# Patient Record
Sex: Female | Born: 1945 | Race: White | Hispanic: No | Smoking: Never smoker
Health system: Southern US, Community
[De-identification: ages and names within clinical notes are randomized; demographics above are authoritative.]

## PROBLEM LIST (undated history)

## (undated) DIAGNOSIS — I2510 Atherosclerotic heart disease of native coronary artery without angina pectoris: Secondary | ICD-10-CM

## (undated) DIAGNOSIS — E119 Type 2 diabetes mellitus without complications: Secondary | ICD-10-CM

## (undated) DIAGNOSIS — I10 Essential (primary) hypertension: Secondary | ICD-10-CM

## (undated) DIAGNOSIS — E785 Hyperlipidemia, unspecified: Secondary | ICD-10-CM

## (undated) HISTORY — DX: Type 2 diabetes mellitus without complications: E11.9

## (undated) HISTORY — DX: Hyperlipidemia, unspecified: E78.5

## (undated) HISTORY — DX: Atherosclerotic heart disease of native coronary artery without angina pectoris: I25.10

## (undated) HISTORY — DX: Essential (primary) hypertension: I10

---

## 2007-04-09 HISTORY — PX: WRIST SURGERY: SHX841

## 2007-10-31 ENCOUNTER — Inpatient Hospital Stay (HOSPITAL_COMMUNITY): Admission: EM | Admit: 2007-10-31 | Discharge: 2007-11-02 | Payer: Self-pay | Admitting: Emergency Medicine

## 2009-02-16 ENCOUNTER — Emergency Department (HOSPITAL_COMMUNITY): Admission: EM | Admit: 2009-02-16 | Discharge: 2009-02-16 | Payer: Self-pay | Admitting: Emergency Medicine
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
## 2010-08-21 NOTE — Op Note (Signed)
NAME:  Christine Evans, Christine Evans NO.:  [id number]   MEDICAL RECORD NO.:  [id number]          PATIENT TYPE:  INP   LOCATION:  6529                         FACILITY:  [facility]   PHYSICIAN:  Madelynn Done, MD  DATE OF BIRTH:  January 25, 1946   DATE OF PROCEDURE:  11/01/2007  DATE OF DISCHARGE:                               OPERATIVE REPORT   PREOPERATIVE DIAGNOSES:  1. Left intra-articular distal radius fracture, 4 or more fragments.  2. Acute left carpal tunnel syndrome.   POSTOPERATIVE DIAGNOSES:  1. Left intra-articular distal radius fracture, 4 or more fragments.  2. Acute left carpal tunnel syndrome.   ATTENDING SURGEON:  Madelynn Done, MD, who has scrubbed and present  for the entire procedure.   ASSISTANT SURGEON:  None.   PROCEDURES:  1. Open treatment of left intra-articular distal radius fracture with      internal fixation, 4 or more fragments.  2. Left carpal tunnel release.  3. Stress radiography, left wrist.   ANESTHESIA:  General via LMA.   SURGICAL IMPLANT:  [employer] volar distal radius plate with  locking pegs distally and 3.5 bicortical screws proximally, 3 screws.   TOURNIQUET TIME:  78 minutes at 250 mmHg.   SURGICAL INDICATIONS:  Christine Evans is a 65 year old right-hand dominant  female who sustained a fall yesterday sustaining a closed left distal  radius fracture.  She was reduced in the emergency department, and after  reduction, the patient noted to continue to have displacement of the  intra-articular fracture.  After the treatment options were discussed  with her, we elected to proceed with the above procedure.  The patient  was also having numbness and tingling in her median nerve distribution,  and it was felt necessary to decompress the median nerve.  The risks,  benefits, and alternatives were discussed in detail with the patient,  and signed informed consent was obtained.   DESCRIPTION OF PROCEDURE:  The patient was properly  identified in the  preoperative holding area and a marker was made on the left wrist to  indicate correct operative site.  The patient was then brought back to  the operating room, placed supine on the anesthesia room table.  General  anesthesia was administered via endotracheal tube.  The patient  tolerated this well.  A well-padded tourniquet was then placed on the  left brachium and sealed with 1000 drape.  The left upper extremity was  then prepped with Hibiclens and then sterilely draped.  A time-out was  called, correct side was identified, and the procedure was then begun.  Preoperative antibiotics were given prior to any skin incision.  Attention was then first turned to the carpal tunnel where a  longitudinal incision was then made directly in line with the radial  border of the ring finger.  In the mid-palm, dissection was carried down  through the skin and subcutaneous tissue to identify the palmar fascia.  The palmar fascia was then identified and incised longitudinally.  Prior  to the carpal tunnel, the limb had been  exsanguinated and tourniquet  insufflated to  250 mmHg.  Dissection was carried down to identify the  transverse carpal ligament under direct visualization with a 15 blade.  The transverse carpal ligament was then released, both proximally and  distally.  There was a small amount of blood within the carpal canal.  Following carpal tunnel release, the wound was then thoroughly  irrigated.  The skin was then closed with 4-0 nylon horizontal mattress  sutures.  Attention was then turned to the distal radius through a  separate incision.  A longitudinal incision was made directly over the  FCR sheath.  Dissection was carried down through the skin and  subcutaneous tissues.  Going between the interval of the radial artery  and the FCR, the FCR sheath was then opened proximally and distally.  This allowed for retraction of FCR ulnarly.  Going through the floor of  the  FCR sheath, the FPL was identified, and the fracture site was then  exposed through elevation of the pronator quadratus.  The distal portion  of pronator quadratus was in the fracture site, and what was allowed to  the pronator quadratus was then elevated off the proximal radial shaft.  Dissection was carried down to the fracture site where there was a  significant comminution of the distal radius with the intra-articular  fragments of 4 more fragments.  An open reduction was then performed.  The patient did have a defect of the metaphysis; therefore, 5 mL of the  OrthoBlast bone substitute was infiltrated into the defect and packed  dorsally.  After packing of the dorsal defect through the volar wound,  the volar distal radius plate was then fashioned.  Using a free 3.5  bicortical screw to the oblong hole, the plate was then fixed proximally  and then the plate height was then adjusted, both radially and ulnarly  using the mini C-arm.  After appropriate adequate placement of the  plate, attention was then turned to the distal fixation then using the  locking pegs.  The locking pegs were then filled distally from an ulnar  to radial direction.  All locking pegs were then placed, smooth pegs.  Following this, images were then obtained; AP, lateral, and oblique  planes, to confirm that there was no intra-articular placement of the  pegs.  Following the distal fixation, attention was then turned  proximally where 2 more bicortical 3.5 screws were then placed.  After  placement of all the fixation, the wound was then thoroughly irrigated.  Final mini C-arm images were then obtained, stress radiography was then  done.  The distal radioulnar joint was also assessed and there was not  any instability in pronation, neutral, and supination of the distal  radioulnar joint.  Following this, there was not any pronator quadratus  to close.  A TLS #7 drain was then placed deep in the subcutaneous   tissues.  The subcutaneous tissues were then closed with 4-0 Vicryl  suture.  The skin was then closed with a running horizontal mattress 4-0  nylon suture.  Adaptic dressing, 20 mL of 0.25% Marcaine was infiltrated  in the wound.  A sterile compressive dressing was then applied.  The  patient was then placed in a well-padded long arm sugar-tong splint for  comfort.   She was extubated and taken to recovery room in good condition.   INTRAOPERATIVE RADIOGRAPHS:  Three views of the wrist do show the volar  plate fixation in place.  There was good restoration of the radial  height, inclination,  and tilt.   POSTOPERATIVE PLAN:  The patient will be admitted overnight for pain  control and IV antibiotics.  She will be discharged when these measures  are accomplished.  She will be seen back in the office in 2 weeks for  wound check and suture removal and then likely transition to a short-arm  cast.  Radiographs at 2-week, 4-week, and 6-week mark.  Likely begin  some gentle motion of the wrist at the 4-week mark.      Madelynn Done, MD  Electronically Signed     FWO/MEDQ  D:  11/01/2007  T:  11/02/2007  Job:  [id number]
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
## 2010-08-24 NOTE — Discharge Summary (Signed)
NAME:  Christine Evans, Christine Evans NO.:  [id number]   MEDICAL RECORD NO.:  [id number]          PATIENT TYPE:  INP   LOCATION:  6529                         FACILITY:  [facility]   PHYSICIAN:  Madelynn Done, MD  DATE OF BIRTH:  01-23-1946   DATE OF ADMISSION:  10/31/2007  DATE OF DISCHARGE:  11/02/2007                               DISCHARGE SUMMARY   ADMISSION DIAGNOSIS:  1. Left wrist intra-articular distal radius fracture.  2. Fall at work.  3. Carpal tunnel.   PROCEDURES AND DATES:  Open reduction and internal fixation of left  distal radius on November 01, 2007.  Left carpal tunnel release.   DISCHARGE MEDICATIONS:  1. Percocet 5/325 one to two tablets every 4 to 6 hours as needed for      pain.  2. Colace 100 mg p.o. b.i.d.  3. Robaxin 500 mg p.o. q.6 h. p.r.n. spasm.   REASON FOR ADMISSION:  Ms. Hyser is a 65-year-old female who was working  and fell backwards landing on an outstretched left wrist.  The patient  was seen and evaluated in the emergency department.  Given the placement  of her injury, she underwent a closed manipulation in the ER.  Closed  manipulation resulted in improvement in alignment, but she still had  displacement and based on her degree of injury and displacement, it was  recommended that she undergo the above procedure.  She was admitted for  pain control and undergo surgery within 24 hours.   HOSPITAL COURSE:  The patient underwent the above procedure under  general anesthesia.  She tolerated this well.  Following surgery, she  was on her pain meds.  She was controlled on IV pain medication.  She  was seen and examined on postoperative day #1.  Her drain was removed  and she was wriggling her fingers and her hand looked good.  Her pain  was controlled on oral pain medication.  She was tolerating a regular  diet and felt ready to be discharged to home.   RECOMMENDATIONS AND DISPOSITION:  The patient is discharged home.  She  will be seen back  in the office in 10 days for wound check, suture  removal, and x-rays, and likely then transition into a short-arm cast.  She will be out of work until her first followup visit.  She was  instructed to keep her splint clean and dry.  She is instructed to call  the office if she does have any worsening fevers, chills, nausea,  vomiting, worsening pain, or problems with her hand.   CONDITION AT DISCHARGE:  Well and good.     Madelynn Done, MD  Electronically Signed    FWO/MEDQ  D:  11/05/2007  T:  11/05/2007  Job:  [id number]

## 2011-01-04 LAB — CBC
HCT: 34.6 — ABNORMAL LOW
Hemoglobin: 11.9 — ABNORMAL LOW
MCHC: 34.4
MCV: 89.9
Platelets: 161
RBC: 3.85 — ABNORMAL LOW
RDW: 12.4
WBC: 6.7

## 2011-01-04 LAB — BASIC METABOLIC PANEL
BUN: 14
CO2: 23
Calcium: 8.8
Chloride: 105
Creatinine, Ser: 0.76
GFR calc Af Amer: >60
GFR calc non Af Amer: >60
Glucose, Bld: 236 — ABNORMAL HIGH
Potassium: 4
Sodium: 133 — ABNORMAL LOW

## 2012-09-09 ENCOUNTER — Other Ambulatory Visit: Payer: Self-pay | Admitting: Family Medicine

## 2012-09-09 DIAGNOSIS — Z139 Encounter for screening, unspecified: Secondary | ICD-10-CM

## 2012-09-15 ENCOUNTER — Ambulatory Visit: Payer: Self-pay

## 2013-04-08 HISTORY — PX: HIP FRACTURE SURGERY: SHX118

## 2017-06-18 ENCOUNTER — Ambulatory Visit: Payer: Medicare HMO | Admitting: Neurology

## 2017-08-14 ENCOUNTER — Encounter: Payer: Self-pay | Admitting: Neurology

## 2017-08-14 ENCOUNTER — Telehealth: Payer: Self-pay

## 2017-08-14 ENCOUNTER — Ambulatory Visit: Payer: Medicare HMO | Admitting: Neurology

## 2017-08-14 NOTE — Telephone Encounter (Signed)
Patient no show for new patient appt today. 

## 2019-01-15 ENCOUNTER — Ambulatory Visit: Payer: Self-pay | Admitting: Cardiology

## 2019-02-24 ENCOUNTER — Ambulatory Visit: Payer: Self-pay | Admitting: Cardiology

## 2020-01-06 ENCOUNTER — Ambulatory Visit: Payer: Self-pay | Admitting: Physician Assistant

## 2020-06-02 ENCOUNTER — Encounter: Payer: Self-pay | Admitting: Cardiology

## 2020-06-02 ENCOUNTER — Other Ambulatory Visit: Payer: Self-pay

## 2020-06-02 ENCOUNTER — Ambulatory Visit: Payer: Medicare HMO | Admitting: Cardiology

## 2020-06-02 VITALS — BP 140/63 | HR 61 | Temp 97.2°F | Resp 16 | Ht 67.0 in | Wt 179.0 lb

## 2020-06-02 DIAGNOSIS — I10 Essential (primary) hypertension: Secondary | ICD-10-CM

## 2020-06-02 DIAGNOSIS — I25119 Atherosclerotic heart disease of native coronary artery with unspecified angina pectoris: Secondary | ICD-10-CM

## 2020-06-02 DIAGNOSIS — E7800 Pure hypercholesterolemia, unspecified: Secondary | ICD-10-CM

## 2020-06-02 DIAGNOSIS — R0789 Other chest pain: Secondary | ICD-10-CM

## 2020-06-02 DIAGNOSIS — R0600 Dyspnea, unspecified: Secondary | ICD-10-CM

## 2020-06-02 DIAGNOSIS — R0989 Other specified symptoms and signs involving the circulatory and respiratory systems: Secondary | ICD-10-CM

## 2020-06-02 DIAGNOSIS — R0609 Other forms of dyspnea: Secondary | ICD-10-CM

## 2020-06-02 MED ORDER — LISINOPRIL-HYDROCHLOROTHIAZIDE 10-12.5 MG PO TABS: 1.0000 | ORAL_TABLET | ORAL | 2 refills | Status: DC

## 2020-06-02 NOTE — Progress Notes (Signed)
Primary Physician/Referring:  No primary care provider on file.  Patient ID: Christine Evans, female    DOB: [birth year], 75 y.o.   MRN: [id number]  Chief Complaint  Patient presents with  . Coronary Artery Disease  . Hyperlipidemia  . New Patient (Initial Visit)    Referred by Bing Matter  . Hypertension   HPI:    Christine Evans  is a 75 y.o. Caucasian female with coronary artery disease with history of MI, underwent stenting to the LAD in twenty thirteen at [facility], hypertension, hyperlipidemia and diabetes mellitus.  I had last seen her in 2018.  Due to COVID-19 pandemic, she has not seen physicians in a while appropriately.  She was out of her cholesterol medication for the past 2 months.  She recently saw [name] and was started back on atorvastatin.  She was referred back to me due to chest pain, dyspnea on exertion and to follow-up on coronary artery disease.  Patient describes chest discomfort as tightness to heaviness and sometimes sharp.  Symptoms started a few months ago.  Her activity is limited due to arthritis.  She is also noticed dyspnea on exertion in spite of arthritis when she tries to help climb up a flight of stairs she has to rest.  The symptoms are new over the past 6 months or so to a year.  No PND or orthopnea, no leg edema.  Past Medical History:  Diagnosis Date  . CAD (coronary artery disease)   . Coronary artery disease   . Diabetes mellitus without complication ([place])   . Hyperlipidemia   . Hypertension    Past Surgical History:  Procedure Laterality Date  . HIP FRACTURE SURGERY Right 2015  . WRIST SURGERY Left 2009   Family History  Problem Relation Age of Onset  . Heart disease Mother   . Heart failure Mother   . Alzheimer's disease Mother   . Heart disease Brother   . Heart failure Brother     Social History   Tobacco Use  . Smoking status: Never Smoker  . Smokeless tobacco: Never Used  Substance Use Topics  .  Alcohol use: Not Currently   Marital Status: Widowed  ROS  Review of Systems  Constitutional: Negative.  Cardiovascular: Positive for chest pain and dyspnea on exertion. Negative for leg swelling.  Musculoskeletal: Positive for arthritis and joint pain.  Gastrointestinal: Negative for melena.   Objective  Blood pressure 140/63, pulse 61, temperature (!) 97.2 F (36.2 C), temperature source Temporal, resp. rate 16, height 5' 7"  (1.702 m), weight 179 lb (81.2 kg), SpO2 99 %.  Vitals with BMI 06/02/2020  Height 5' 7"   Weight 179 lbs  BMI 49.44  Systolic 967  Diastolic 63  Pulse 61     Physical Exam Cardiovascular:     Rate and Rhythm: Normal rate and regular rhythm.     Pulses: Intact distal pulses.          Carotid pulses are on the right side with bruit and on the left side with bruit.    Heart sounds: Murmur heard.   Early systolic murmur is present with a grade of 2/6 at the upper right sternal border. No gallop.      Comments: No leg edema, no JVD. Pulmonary:     Effort: Pulmonary effort is normal.     Breath sounds: Normal breath sounds.  Abdominal:     General: Bowel sounds are normal.     Palpations:  Abdomen is soft.    Laboratory examination:   External labs:   Labs 05/18/2020:  Sodium one forty-one, potassium 4.3, serum glucose 123 mg, BUN one eight, creatinine 0.89, EGFR >60 mL.  CMP otherwise normal.  A1c 6.7%.  TSH normal.  Hb 13.5/HCT 40.9, platelets 210   Total cholesterol 223, triglycerides 336, HDL forty-one, direct LDL 116, non-HDL cholesterol 182.  Medications and allergies  No Known Allergies   Outpatient Medications Prior to Visit  Medication Sig Dispense Refill  . atorvastatin (LIPITOR) 40 MG tablet Take 1 tablet by mouth at bedtime.    . Cholecalciferol 125 MCG (5000 UT) capsule Take 1 capsule by mouth daily.    . [employer] ASPIRIN 325 MG tablet TAKE ONE TABLET (325 MG DOSE) BY MOUTH DAILY.  0  . cyclobenzaprine (FLEXERIL) 10 MG tablet TAKE  ONE TABLET (10 MG DOSE) BY MOUTH DAILY.  2  . glimepiride (AMARYL) 2 MG tablet TAKE ONE TABLET (2 MG DOSE) BY MOUTH 2 (TWO) TIMES DAILY.  1  . Krill Oil (OMEGA-3) 500 MG CAPS Take 1 capsule by mouth daily.    . Magnesium Gluconate 550 MG TABS Take 1 tablet by mouth daily.    . meloxicam (MOBIC) 7.5 MG tablet TAKE ONE TABLET (7.5 MG TOTAL) BY MOUTH DAILY.  2  . metFORMIN (GLUCOPHAGE) 1000 MG tablet Take 1 tablet by mouth in the morning and at bedtime.    . metoprolol succinate (TOPROL-XL) 50 MG 24 hr tablet Take 50 mg by mouth in the morning and at bedtime.    . nitroGLYCERIN (NITROSTAT) 0.4 MG SL tablet 1 (ONE) TAB SUB TAB SUBLINGUA TAB SUBLINGUAL EVERY 5 MINUTES AS NEEDED FOR CHEST PAIN.  6  . omeprazole (PRILOSEC) 20 MG capsule Take by mouth daily.  2  . oxyCODONE-acetaminophen (PERCOCET/ROXICET) 5-325 MG tablet Take 1 tablet by mouth every 6 (six) hours as needed. for pain  0  . Zinc Gluconate 10 MG LOZG Take 1 tablet by mouth daily.    Marland Kitchen lisinopril (ZESTRIL) 5 MG tablet Take 1 tablet by mouth daily.     No facility-administered medications prior to visit.    Radiology:   No results found.  Cardiac Studies:   Heart Cath 11/14/2011 Stent to LAD 3.0x15 mm Xience for acute anterior MI in [place], [place], [place]  Echocardiogram 06/09/2014: Left ventricle cavity is normal in size. Normal global wall motion. Doppler evidence of grade I (impaired) diastolic dysfunction. Diastolic dysfunction findings suggests elevated LA/LV end diastolic pressure. Calculated EF 65%. Left atrial cavity is moderately dilated. Right atrial cavity is mildly dilated. Prominent Eusthasian valve noted. Right ventricle cavity is mildly dilated. Mild concentric hypertrophy. Normal right ventricular function. Moderator band seen at the apex. Mild tricuspid regurgitation. No evidence of pulmonary hypertension, however IVC is dilated with blunted respiratory response suggests elevated right heart pressure.  Lexiscan  myoview stress test 06/17/2014: 1. The resting electrocardiogram demonstrated normal sinus rhythm, normal resting conduction, no resting arrhythmias and nonspecific ST-T changes in the inferior and lateral leads. Stress EKG is nondiagnostic for ischemia as it is a pharmacologic stress test. stress symptoms included dyspnea and dizziness. 2. Stress and rest SPECT images demonstrate homogeneous tracer distribution throughout the myocardium. Gated SPECT imaging reveals normal myocardial thickening and wall motion. The left ventricular ejection fraction was normal visually, calculated EF was 47%. This represents a low risk study. Compared to 08/07/2012, no significant change, previously calculated EF was normal.  EKG:     EKG 06/02/2020: Normal sinus rhythm  at rate of 63 bpm, left atrial enlargement, normal axis.  Cannot exclude septal infarct old.  No evidence of ischemia, normal QT interval.  No significant change from EKG 01/16/2017   Assessment     ICD-10-CM   1. Coronary artery disease involving native coronary artery of native heart with angina pectoris ([place])  I25.119 PCV MYOCARDIAL PERFUSION WITH LEXISCAN    PCV ECHOCARDIOGRAM COMPLETE  2. Essential hypertension  I10 EKG 12-Lead    lisinopril-hydrochlorothiazide (ZESTORETIC) 10-12.5 MG tablet    Basic metabolic panel  3. Hypercholesteremia  E78.00 LDL cholesterol, direct    Lipid Panel With LDL/HDL Ratio  4. Atypical chest pain  R07.89 PCV MYOCARDIAL PERFUSION WITH LEXISCAN  5. Bilateral carotid bruits  R09.89 PCV CAROTID DUPLEX (BILATERAL)  6. Dyspnea on exertion  R06.00 PCV ECHOCARDIOGRAM COMPLETE     Medications Discontinued During This Encounter  Medication Reason  . lisinopril (ZESTRIL) 5 MG tablet Change in therapy    Meds ordered this encounter  Medications  . lisinopril-hydrochlorothiazide (ZESTORETIC) 10-12.5 MG tablet    Sig: Take 1 tablet by mouth every morning.    Dispense:  30 tablet    Refill:  2   Orders Placed This  Encounter  Procedures  . LDL cholesterol, direct  . Lipid Panel With LDL/HDL Ratio  . Basic metabolic panel  . PCV MYOCARDIAL PERFUSION WITH LEXISCAN    Standing Status:   Future    Standing Expiration Date:   07/31/2020  . EKG 12-Lead  . PCV ECHOCARDIOGRAM COMPLETE    Standing Status:   Future    Standing Expiration Date:   06/02/2021    Recommendations:   GAYLENE MOYLAN is a 75 y.o. Caucasian female with coronary artery disease with history of MI, underwent stenting to the LAD in twenty thirteen at [facility], hypertension, hyperlipidemia and diabetes mellitus.  I had last seen her in 2018. Due to COVID-19 pandemic, she has not seen physicians in a while appropriately.  She was out of her cholesterol medication for the past 2 months.  She recently saw [name] and was started back on atorvastatin.  She was referred back to me due to chest pain, dyspnea on exertion and to follow-up on coronary artery disease.  Patient symptoms chest pain are very difficult to make out whether it is anginal symptoms as her activity is limited by arthritis. Schedule for a Lexiscan Nuclear stress test to evaluate for myocardial ischemia. Patient unable to do treadmill stress testing due to arthritis. Will schedule for an echocardiogram. Schedule for carotid duplex for bruit/follow-up surveillance of carotid stenosis.   Blood pressure is elevated, will discontinue lisinopril 5 mg and switch her to lisinopril HCT 10/12.5 mg daily, I reviewed external labs, lipids are uncontrolled.  She is now back on Lipitor, she was off of the medication for 2 months when she got the labs.  However in view of significant cardiovascular risks, I would like her to have labs done again in 3 to 4 weeks to be aggressive with lipid control.  She also obtain a BMP as I am starting her on lisinopril HCT.  Otherwise she is on appropriate medical therapy.  I would like to have started her on SGLT2 inhibitor however cost is  prohibitive for her.  Office visit in 4 to 6 weeks for follow-up.    Adrian Prows, MD, [facility] 06/02/2020, 1:05 PM Office: [phone]

## 2020-06-02 NOTE — Patient Instructions (Signed)
Please have blood work done at [facility] anywhere close to home, in 3 to 4 weeks from now.  You have been scheduled for ultrasound of your heart and carotid arteries and also a stress test to evaluate for any blockages in your heart.  He will stop taking lisinopril and I will switch you to a new drug called lisinopril HCT.  It has been sent to the local pharmacy.

## 2020-06-05 ENCOUNTER — Other Ambulatory Visit: Payer: Self-pay

## 2020-06-05 ENCOUNTER — Ambulatory Visit: Payer: Medicare HMO

## 2020-06-05 DIAGNOSIS — R0789 Other chest pain: Secondary | ICD-10-CM

## 2020-06-05 DIAGNOSIS — I25119 Atherosclerotic heart disease of native coronary artery with unspecified angina pectoris: Secondary | ICD-10-CM

## 2020-06-16 ENCOUNTER — Ambulatory Visit: Payer: Medicare HMO

## 2020-06-16 ENCOUNTER — Other Ambulatory Visit: Payer: Self-pay

## 2020-06-16 DIAGNOSIS — R0600 Dyspnea, unspecified: Secondary | ICD-10-CM

## 2020-06-16 DIAGNOSIS — I25119 Atherosclerotic heart disease of native coronary artery with unspecified angina pectoris: Secondary | ICD-10-CM

## 2020-06-16 DIAGNOSIS — I6523 Occlusion and stenosis of bilateral carotid arteries: Secondary | ICD-10-CM

## 2020-06-16 DIAGNOSIS — R0609 Other forms of dyspnea: Secondary | ICD-10-CM

## 2020-06-16 DIAGNOSIS — R0989 Other specified symptoms and signs involving the circulatory and respiratory systems: Secondary | ICD-10-CM

## 2020-06-26 NOTE — Progress Notes (Signed)
Dampened flow in the left carotid artery, may need CT angiogram.  I have left a telephone message and also sent my chart message to the patient.

## 2020-06-27 ENCOUNTER — Telehealth: Payer: Self-pay

## 2020-06-27 DIAGNOSIS — I6523 Occlusion and stenosis of bilateral carotid arteries: Secondary | ICD-10-CM

## 2020-06-27 DIAGNOSIS — R0989 Other specified symptoms and signs involving the circulatory and respiratory systems: Secondary | ICD-10-CM

## 2020-06-27 NOTE — Telephone Encounter (Signed)
    Subject Delivery         Carotid results 06/26/2020 5:58 PM    To: [name]    From: Yates Decamp, MD    Created: 06/26/2020 5:58 PM     I left a message on your cell phone.  You can respond to this email, your carotid arteries show evidence of blockages and Dopplers are not very very clear of what this could be.  You may benefit from CT angiogram of the neck and head to evaluate carotid stenosis.  If you are agreeable, I will go ahead and place the orders.        Patient is agreeable to have CT angiogram. Please add orders.

## 2020-06-27 NOTE — Telephone Encounter (Signed)
  Chief Complaint  Patient presents with  . Results    Carotid stenosis     ICD-10-CM   1. Bilateral carotid bruits  R09.89 CT ANGIO NECK W OR WO CONTRAST  2. Asymptomatic bilateral carotid artery stenosis  I65.23 CT ANGIO NECK W OR WO CONTRAST    Orders Placed This Encounter  Procedures  . CT ANGIO NECK W OR WO CONTRAST    Carotid artery duplex 06/16/2020: Stenosis in the right internal carotid artery (16-49%). There is markedly reduced and dampened velocity noted in the left ICA without significant plaque burden and may suggest distal occlusion or stenosis. Doppler velocity suggests stenosis in the left external carotid artery (<50%). Antegrade right vertebral artery flow. Antegrade left vertebral artery flow. Consider CTA neck to evaluate significant of left carotid stenosis, if present. Follow up in one year is appropriate if clinically indicated.    Standing Status:   Future    Standing Expiration Date:   06/27/2021    Order Specific Question:   If indicated for the ordered procedure, I authorize the administration of contrast media per Radiology protocol    Answer:   Yes    Order Specific Question:   Preferred imaging location?    Answer:   [id number]    Yates Decamp, MD, [facility] 06/27/2020, 5:54 PM Office: [phone] Pager: [phone]

## 2020-06-27 NOTE — Telephone Encounter (Signed)
  Subject Delivery         Carotid results 06/26/2020 5:58 PM    To: Kamry C Wieber    From: Ganji, Jay, MD    Created: 06/26/2020 5:58 PM     I left a message on your cell phone.  You can respond to this email, your carotid arteries show evidence of blockages and Dopplers are not very very clear of what this could be.  You may benefit from CT angiogram of the neck and head to evaluate carotid stenosis.  If you are agreeable, I will go ahead and place the orders.        Patient is agreeable to have CT angiogram. Please add orders. 

## 2020-07-19 ENCOUNTER — Ambulatory Visit
Admission: RE | Admit: 2020-07-19 | Discharge: 2020-07-19 | Disposition: A | Discharge status: Home / Self Care | Payer: Medicare HMO | Source: Ambulatory Visit | Attending: Cardiology | Admitting: Cardiology

## 2020-07-19 DIAGNOSIS — I6523 Occlusion and stenosis of bilateral carotid arteries: Secondary | ICD-10-CM

## 2020-07-19 DIAGNOSIS — R0989 Other specified symptoms and signs involving the circulatory and respiratory systems: Secondary | ICD-10-CM

## 2020-07-19 MED ORDER — IOPAMIDOL (ISOVUE-370) INJECTION 76%
75.0000 mL | Freq: Once | INTRAVENOUS | Status: AC | PRN
  Administered 2020-07-19: 75 mL via INTRAVENOUS

## 2020-07-20 LAB — LIPID PANEL WITH LDL/HDL RATIO
Cholesterol, Total: 112 mg/dL (ref 100–199)
HDL: 48 mg/dL (ref >39)
LDL Chol Calc (NIH): 38 mg/dL (ref 0–99)
LDL/HDL Ratio: 0.8 ratio (ref 0.0–3.2)
Triglycerides: 157 mg/dL — ABNORMAL HIGH (ref 0–149)
VLDL Cholesterol Cal: 26 mg/dL (ref 5–40)

## 2020-07-20 LAB — BASIC METABOLIC PANEL
BUN/Creatinine Ratio: 23 (ref 12–28)
BUN: 21 mg/dL (ref 8–27)
CO2: 20 mmol/L (ref 20–29)
Calcium: 10 mg/dL (ref 8.7–10.3)
Chloride: 102 mmol/L (ref 96–106)
Creatinine, Ser: 0.92 mg/dL (ref 0.57–1.00)
Glucose: 110 mg/dL — ABNORMAL HIGH (ref 65–99)
Potassium: 4.6 mmol/L (ref 3.5–5.2)
Sodium: 137 mmol/L (ref 134–144)
eGFR: 66 mL/min/{1.73_m2} (ref >59)

## 2020-07-20 LAB — LDL CHOLESTEROL, DIRECT: LDL Direct: 40 mg/dL (ref 0–99)

## 2020-07-28 ENCOUNTER — Ambulatory Visit: Payer: Medicare HMO | Admitting: Cardiology

## 2020-07-28 ENCOUNTER — Other Ambulatory Visit: Payer: Self-pay

## 2020-07-28 ENCOUNTER — Encounter: Payer: Self-pay | Admitting: Cardiology

## 2020-07-28 VITALS — BP 138/70 | HR 72 | Temp 98.0°F | Ht 67.0 in | Wt 176.6 lb

## 2020-07-28 DIAGNOSIS — R0989 Other specified symptoms and signs involving the circulatory and respiratory systems: Secondary | ICD-10-CM

## 2020-07-28 DIAGNOSIS — I25119 Atherosclerotic heart disease of native coronary artery with unspecified angina pectoris: Secondary | ICD-10-CM

## 2020-07-28 DIAGNOSIS — Q278 Other specified congenital malformations of peripheral vascular system: Secondary | ICD-10-CM

## 2020-07-28 DIAGNOSIS — I10 Essential (primary) hypertension: Secondary | ICD-10-CM

## 2020-07-28 DIAGNOSIS — E7800 Pure hypercholesterolemia, unspecified: Secondary | ICD-10-CM

## 2020-07-28 NOTE — Progress Notes (Signed)
Primary Physician/Referring:  Aletha Halim., PA-C  Patient ID: Christine Evans, female    DOB: [birth year], 75 y.o.   MRN: [id number]  Chief Complaint  Patient presents with  . Chest Pain  . Follow-up  . Coronary Artery Disease  . Fall    Pt fell last night   HPI:    Christine Evans  is a 75 y.o. Caucasian female with coronary artery disease with history of MI, underwent stenting to the LAD in twenty thirteen at [facility], hypertension, hyperlipidemia and diabetes mellitus.  I had last seen her in 2018.  Due to COVID-19 pandemic, she has not seen physicians in a while appropriately. She recently saw [name] and was started back on atorvastatin.  She was referred back to me due to chest pain, dyspnea on exertion and to follow-up on coronary artery disease.  I had seen her 6 weeks ago and ordered stress test, echocardiogram and carotid duplex.  She now presents for follow-up.  No new symptomatology.  Patient describes chest discomfort as tightness to heaviness and sometimes sharp.  Symptoms started a few months ago.  Her activity is limited due to arthritis.  She is also noticed dyspnea on exertion in spite of arthritis when she tries to help climb up a flight of stairs she has to rest. No PND or orthopnea, no leg edema.  Past Medical History:  Diagnosis Date  . CAD (coronary artery disease)   . Coronary artery disease   . Diabetes mellitus without complication ([place])   . Hyperlipidemia   . Hypertension    Past Surgical History:  Procedure Laterality Date  . HIP FRACTURE SURGERY Right 2015  . WRIST SURGERY Left 2009   Family History  Problem Relation Age of Onset  . Heart disease Mother   . Heart failure Mother   . Alzheimer's disease Mother   . Heart disease Brother   . Heart failure Brother     Social History   Tobacco Use  . Smoking status: Never Smoker  . Smokeless tobacco: Never Used  Substance Use Topics  . Alcohol use: Not Currently    Marital Status: Widowed  ROS  Review of Systems  Constitutional: Negative.  Cardiovascular: Positive for chest pain and dyspnea on exertion. Negative for leg swelling.  Musculoskeletal: Positive for arthritis and joint pain.  Gastrointestinal: Negative for melena.   Objective  Blood pressure 138/70, pulse 72, temperature 98 F (36.7 C), height _0  (1.702 m), weight 176 lb 9.6 oz (80.1 kg), SpO2 97 %.  Vitals with BMI 07/28/2020 06/02/2020  Height _1  _2   Weight 176 lbs 10 oz 179 lbs  BMI 18.56 31.49  Systolic 702 637  Diastolic 70 63  Pulse 72 61     Physical Exam Cardiovascular:     Rate and Rhythm: Normal rate and regular rhythm.     Pulses: Intact distal pulses.          Carotid pulses are on the right side with bruit and on the left side with bruit.    Heart sounds: Murmur heard.   Early systolic murmur is present with a grade of 2/6 at the upper right sternal border. No gallop.      Comments: No leg edema, no JVD. Pulmonary:     Effort: Pulmonary effort is normal.     Breath sounds: Normal breath sounds.  Abdominal:     General: Bowel sounds are normal.     Palpations: Abdomen is  soft.    Laboratory examination:   Lipid Panel Recent Labs    07/19/20 1054  CHOL 112  TRIG 157*  LDLCALC 38  HDL 48  LDLDIRECT 40    BMP Latest Ref Rng & Units 07/19/2020 10/31/2007  Glucose 65 - 99 mg/dL 110(H) 236(H)  BUN 8 - 27 mg/dL 21 14  Creatinine 0.57 - 1.00 mg/dL 0.92 0.76  BUN/Creat Ratio 12 - 28 23 -  Sodium 134 - 144 mmol/L 137 133(L)  Potassium 3.5 - 5.2 mmol/L 4.6 4.0  Chloride 96 - 106 mmol/L 102 105  CO2 20 - 29 mmol/L 20 23  Calcium 8.7 - 10.3 mg/dL 10.0 8.8    External labs:   Labs 05/18/2020:  Sodium one forty-one, potassium 4.3, serum glucose 123 mg, BUN one eight, creatinine 0.89, EGFR >60 mL.  CMP otherwise normal.  A1c 6.7%.  TSH normal.  Hb 13.5/HCT 40.9, platelets 210   Total cholesterol 223, triglycerides 336, HDL forty-one, direct  LDL 116, non-HDL cholesterol 182.  Medications and allergies  No Known Allergies   Outpatient Medications Prior to Visit  Medication Sig Dispense Refill  . atorvastatin (LIPITOR) 40 MG tablet Take 1 tablet by mouth at bedtime.    . Cholecalciferol 125 MCG (5000 UT) capsule Take 1 capsule by mouth daily.    Marland Kitchen glimepiride (AMARYL) 2 MG tablet TAKE ONE TABLET (2 MG DOSE) BY MOUTH 2 (TWO) TIMES DAILY.  1  . Krill Oil (OMEGA-3) 500 MG CAPS Take 1 capsule by mouth daily.    Marland Kitchen lisinopril-hydrochlorothiazide (ZESTORETIC) 10-12.5 MG tablet Take 1 tablet by mouth every morning. 30 tablet 2  . Magnesium Gluconate 550 MG TABS Take 1 tablet by mouth daily.    . meloxicam (MOBIC) 7.5 MG tablet TAKE ONE TABLET (7.5 MG TOTAL) BY MOUTH DAILY.  2  . metFORMIN (GLUCOPHAGE) 1000 MG tablet Take 1 tablet by mouth in the morning and at bedtime.    . metoprolol succinate (TOPROL-XL) 50 MG 24 hr tablet Take 50 mg by mouth in the morning and at bedtime.    . nitroGLYCERIN (NITROSTAT) 0.4 MG SL tablet 1 (ONE) TAB SUB TAB SUBLINGUA TAB SUBLINGUAL EVERY 5 MINUTES AS NEEDED FOR CHEST PAIN.  6  . omeprazole (PRILOSEC) 20 MG capsule Take by mouth daily.  2  . oxyCODONE-acetaminophen (PERCOCET/ROXICET) 5-325 MG tablet Take 1 tablet by mouth every 6 (six) hours as needed. for pain  0  . Zinc Gluconate 10 MG LOZG Take 1 tablet by mouth daily.    . CVS ASPIRIN 325 MG tablet TAKE ONE TABLET (325 MG DOSE) BY MOUTH DAILY.  0  . cyclobenzaprine (FLEXERIL) 10 MG tablet TAKE ONE TABLET (10 MG DOSE) BY MOUTH DAILY.  2   No facility-administered medications prior to visit.    Radiology:   No results found.  Cardiac Studies:   Coronary angiogram 11/14/2011: Stent to LAD 3.0x15 mm Xience for acute anterior MI in [place], [place], [place]   Lexiscan Tetrofosmin Stress Test  06/05/2020: Nondiagnostic ECG stress. Myocardial perfusion is normal. Overall LV systolic function is normal without regional wall motion abnormalities.  Stress LV EF: 66%.  No significant change from 06/17/2014. Low risk.  Echocardiogram 06/16/2020: Normal LV systolic function with visual EF 60-65%. Left ventricle cavity is normal in size. Mild left ventricular hypertrophy, septal bulge present.  Normal global wall motion. Normal diastolic filling pattern, normal LAP.  Left atrial cavity is slightly dilated. Native mitral valve. No evidence of mitral stenosis. Mild (Grade I)  mitral regurgitation. Mild mitral valve leaflet thickening with mild calcification. Structurally normal tricuspid valve. No evidence of tricuspid stenosis. Mild to moderate tricuspid regurgitation. Mild pulmonary hypertension. RVSP measures 35 mmHg. IVC is dilated with a respiratory response of >50%. Compared to prior study dated 06/09/2014: Mild MR is new, Mild PHTN is new, otherwise no significant change.   Carotid artery duplex 06/16/2020: Stenosis in the right internal carotid artery (16-49%). There is markedly reduced and dampened velocity noted in the left ICA without significant plaque burden and may suggest distal occlusion or stenosis. Doppler velocity suggests stenosis in the left external carotid artery (<50%). Antegrade right vertebral artery flow. Antegrade left vertebral artery flow. Consider CTA neck to evaluate significant of left carotid stenosis, if present.  Follow up in one year is appropriate if clinically indicated.  CT angiogram neck with and without contrast 07/19/2020: 1. Diffusely small left ICA. No proximal stenosis to suggest underfilling and no clear signs of prior dissection. This could be developmental but the carotid canal size and the circle-of-Willis anatomy is not covered. 2. Carotid atherosclerosis without focal and flow limiting stenosis. 3. Aortic arch: Atheromatous arch with 3 vessel branching. No acute finding or visible dilatation.  EKG:     EKG 06/02/2020: Normal sinus rhythm at rate of 63 bpm, left atrial enlargement, normal axis.   Cannot exclude septal infarct old.  No evidence of ischemia, normal QT interval.  No significant change from EKG 01/16/2017   Assessment     ICD-10-CM   1. Coronary artery disease involving native coronary artery of native heart with angina pectoris ([place])  I25.119   2. Bilateral carotid bruits  R09.89   3. 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 Essential hypertension  I10   5. Hypercholesteremia  E78.00      There are no discontinued medications.  No orders of the defined types were placed in this encounter.  No orders of the defined types were placed in this encounter.   Recommendations:   Christine Evans is a 75 y.o. Caucasian female with coronary artery disease with history of MI, underwent stenting to the LAD in 2013 at [facility], hypertension, hyperlipidemia and diabetes mellitus.  I had last seen her in 2018. Due to COVID-19 pandemic, she has not seen physicians in a while appropriately.  I have seen her 6 weeks ago for evaluation of chest pain, underwent stress test, echocardiogram and carotid duplex.  I reviewed the results of the nuclear stress test and it is reassuring.  Also echocardiogram does not reveal any significant valvular abnormality and normal LVEF.  Reviewed her carotid artery duplex, she has hypoplastic left carotid artery and bruit in the right side is probably related to increased flow.  No further evaluation is indicated as she remains asymptomatic.  With regard to hyperlipidemia, LDL has improved since being back on Crestor, triglyceride elevation is minimal and patient is already made lifestyle changes and has lost 3 pounds in weight since last office visit.  For now continue present medications, chest pain symptoms at most are atypical, advised her to increase physical activity as tolerated and to contact me if she has exertional chest pain.  Patient felt reassured.   Adrian Prows, MD, [facility] 07/28/2020, 2:27 PM Office:  [phone]

## 2020-08-27 ENCOUNTER — Other Ambulatory Visit: Payer: Self-pay | Admitting: Cardiology

## 2020-08-27 DIAGNOSIS — I10 Essential (primary) hypertension: Secondary | ICD-10-CM

## 2020-08-28 ENCOUNTER — Other Ambulatory Visit: Payer: Self-pay

## 2020-08-28 MED ORDER — CYCLOBENZAPRINE HCL 10 MG PO TABS: 10.0000 mg | ORAL_TABLET | Freq: Every day | ORAL | 2 refills | Status: DC

## 2020-10-27 ENCOUNTER — Other Ambulatory Visit: Payer: Self-pay | Admitting: Cardiology

## 2020-10-27 DIAGNOSIS — I10 Essential (primary) hypertension: Secondary | ICD-10-CM

## 2021-06-27 ENCOUNTER — Other Ambulatory Visit: Payer: Self-pay

## 2021-06-27 MED ORDER — NITROGLYCERIN 0.4 MG SL SUBL: SUBLINGUAL_TABLET | SUBLINGUAL | 6 refills | Status: AC

## 2021-07-30 ENCOUNTER — Encounter: Payer: Self-pay | Admitting: Cardiology

## 2021-07-30 ENCOUNTER — Ambulatory Visit: Payer: Medicare HMO | Admitting: Cardiology

## 2021-07-30 VITALS — BP 127/71 | HR 75 | Temp 98.0°F | Resp 16 | Ht 67.0 in | Wt 168.0 lb

## 2021-07-30 DIAGNOSIS — E782 Mixed hyperlipidemia: Secondary | ICD-10-CM

## 2021-07-30 DIAGNOSIS — Q278 Other specified congenital malformations of peripheral vascular system: Secondary | ICD-10-CM

## 2021-07-30 DIAGNOSIS — I10 Essential (primary) hypertension: Secondary | ICD-10-CM

## 2021-07-30 DIAGNOSIS — I25119 Atherosclerotic heart disease of native coronary artery with unspecified angina pectoris: Secondary | ICD-10-CM

## 2021-07-30 MED ORDER — FENOFIBRATE 48 MG PO TABS: 48.0000 mg | ORAL_TABLET | Freq: Every day | ORAL | 3 refills | Status: DC

## 2021-07-30 NOTE — Progress Notes (Signed)
? ?Primary Physician/Referring:  Aletha Halim., PA-C ? ?Patient ID: Christine Evans, female    DOB: Feb 08, 1946, 76 y.o.   MRN: [id number] ? ?Chief Complaint  ?Patient presents with  ? Coronary Artery Disease  ? Hypertension  ? Hyperlipidemia  ? Follow-up  ?  1 year  ? ?HPI:   ? ?Christine Evans  is a 76 y.o. 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  ? ?This is annual visit and follow-up of coronary disease. Since last office visit a year ago, she has lost about 10 pounds in weight and is trying her best to make lifestyle changes.  Remains asymptomatic. ? ?Past Medical History:  ?Diagnosis Date  ? CAD (coronary artery disease)   ? Coronary artery disease   ? Diabetes mellitus without complication ([place])   ? Hyperlipidemia   ? Hypertension   ? ?Past Surgical History:  ?Procedure Laterality Date  ? HIP FRACTURE SURGERY Right 2015  ? WRIST SURGERY Left 2009  ? ?Family History  ?Problem Relation Age of Onset  ? Heart disease Mother   ? Heart failure Mother   ? Alzheimer's disease Mother   ? Heart disease Brother   ? Heart failure Brother   ?  ?Social History  ? ?Tobacco Use  ? Smoking status: Never  ? Smokeless tobacco: Never  ?Substance Use Topics  ? Alcohol use: Not Currently  ? ?Marital Status: Widowed  ?ROS  ?Review of Systems  ?Constitutional: Negative.  ?Cardiovascular:  Negative for chest pain, dyspnea on exertion and leg swelling.  ?Gastrointestinal:  Negative for melena.  ?Objective  ?Blood pressure 127/71, pulse 75, temperature 98 ?F (36.7 ?C), temperature source Temporal, resp. rate 16, height $RemoveBe'5\' 7"'IGrCnUamo$  (1.702 m), weight 168 lb (76.2 kg), SpO2 95 %.  ? ?  07/30/2021  ?  3:13 PM 07/28/2020  ?  1:41 PM 06/02/2020  ? 11:09 AM  ?Vitals with BMI  ?Height $Remove'5\' 7"'ljNPggr$  $RemoveB'5\' 7"'zXqWmnZS$  $RemoveBe'5\' 7"'fXQJcKPef$   ?Weight 168 lbs 176 lbs 10 oz 179 lbs  ?BMI 26.31 27.65 28.03   ?Systolic 782 956 213  ?Diastolic 71 70 63  ?Pulse 75 72 61  ?  ? Physical Exam ?Neck:  ?   Vascular: No JVD.  ?Cardiovascular:  ?   Rate and Rhythm: Normal rate and regular rhythm.  ?   Pulses: Intact distal pulses.     ?     Carotid pulses are  on the right side with bruit and  on the left side with bruit. ?   Heart sounds: Murmur heard.  ?Early systolic murmur is present with a grade of 2/6 at the upper right sternal border.  ?  No gallop.  ?Pulmonary:  ?   Effort: Pulmonary effort is normal.  ?   Breath sounds: Normal breath sounds.  ?Abdominal:  ?   General: Bowel sounds are normal.  ?   Palpations: Abdomen is soft.  ?Musculoskeletal:  ?   Right lower leg: No edema.  ?   Left lower leg: No edema.  ? ?Laboratory examination:  ?  ?External labs:  ? ?Labs 07/11/2021: ? ?Total cholesterol 174, triglycerides 264, HDL 40, LDL 77.  Non-HDL cholesterol 134. ? ?A1c 7.0%. ? ?BUN 28, creatinine 1.31, EGFR 43 mL, potassium 4.5, LFTs normal.  Magnesium 1.3. ? ?Hb 11.9/HCT 35.9, platelets 167, normal indicis. ? ?Labs 05/18/2020: ? ?  A1c 6.7%.  TSH normal. ? ?Medications and allergies  ?No Known Allergies  ? ? ?Current Outpatient Medications:  ?  atorvastatin (LIPITOR) 40 MG tablet, Take 1 tablet by mouth at bedtime., Disp: , Rfl:  ?  Cholecalciferol 125 MCG (5000 UT) capsule, Take 1 capsule by mouth daily., Disp: , Rfl:  ?  [employer]S ASPIRIN 325 MG tablet, TAKE ONE TABLET (325 MG DOSE) BY MOUTH DAILY., Disp: , Rfl: 0 ?  cyclobenzaprine (FLEXERIL) 10 MG tablet, Take 1 tablet (10 mg total) by mouth daily., Disp: 30 tablet, Rfl: 2 ?  fenofibrate (TRICOR) 48 MG tablet, Take 1 tablet (48 mg total) by mouth daily., Disp: 90 tablet, Rfl: 3 ?  glimepiride (AMARYL) 2 MG tablet, TAKE ONE TABLET (2 MG DOSE) BY MOUTH 2 (TWO) TIMES DAILY., Disp: , Rfl: 1 ?  Krill Oil (OMEGA-3) 500 MG CAPS, Take 1 capsule by mouth daily., Disp: , Rfl:  ?  lisinopril-hydrochlorothiazide (ZESTORETIC) 10-12.5 MG tablet, TAKE 1 TABLET BY MOUTH EVERY DAY IN THE  MORNING, Disp: 90 tablet, Rfl: 3 ?  Magnesium Gluconate 550 MG TABS, Take 1 tablet by mouth daily., Disp: , Rfl:  ?  meloxicam (MOBIC) 7.5 MG tablet, TAKE ONE TABLET (7.5 MG TOTAL) BY MOUTH DAILY., Disp: , Rfl: 2 ?  metFORMIN (GLUCOPHAGE) 1000 MG tablet, Take 1 tablet by mouth in the morning and at bedtime., Disp: , Rfl:  ?  metoprolol succinate (TOPROL-XL) 50 MG 24 hr tablet, Take 50 mg by mouth in the morning and at bedtime., Disp: , Rfl:  ?  nitroGLYCERIN (NITROSTAT) 0.4 MG SL tablet, 1 (ONE) TAB SUB TAB SUBLINGUA TAB SUBLINGUAL EVERY 5 MINUTES AS NEEDED FOR CHEST PAIN., Disp: 30 tablet, Rfl: 6 ?  omeprazole (PRILOSEC) 20 MG capsule, Take by mouth daily., Disp: , Rfl: 2 ?  oxyCODONE-acetaminophen (PERCOCET/ROXICET) 5-325 MG tablet, Take 1 tablet by mouth every 6 (six) hours as needed. for pain, Disp: , Rfl: 0 ?  Zinc Gluconate 10 MG LOZG, Take 1 tablet by mouth daily., Disp: , Rfl:   ?Radiology:  ? ?No results found. ? ?Cardiac Studies:  ? ?Coronary angiogram 11/14/2011: Stent to LAD 3.0x15 mm Xience for acute anterior MI in [place], [place], [place] ? ?Lexiscan Tetrofosmin Stress Test  06/05/2020: ?Nondiagnostic ECG stress. ?Myocardial perfusion is normal. ?Overall LV systolic function is normal without regional wall motion abnormalities. Stress LV EF: 66%.  ?No significant change from 06/17/2014. Low risk. ? ?Echocardiogram 06/16/2020: ?Normal LV systolic function with visual EF 60-65%. Left ventricle cavity is normal in size. Mild left ventricular hypertrophy, septal bulge present.  Normal global wall motion. Normal diastolic filling pattern, normal LAP.  ?Left atrial cavity is slightly dilated. ?Native mitral valve. No evidence of mitral stenosis. Mild (Grade I) mitral regurgitation. Mild mitral valve leaflet thickening with mild calcification. ?Structurally normal tricuspid valve. No evidence of tricuspid stenosis. Mild to moderate tricuspid regurgitation. Mild pulmonary hypertension. RVSP measures 35  mmHg. ?IVC is dilated with a respiratory response of >50%. ?Compared to prior study dated 06/09/2014: Mild MR is new, Mild PHTN is new, otherwise no significant change.  ? ?Carotid artery duplex 06/16/2020: ?Stenosis in the right internal carotid artery (16-49%). ?There is markedly reduced and dampened velocity noted in the left ICA without significant plaque burden and may suggest distal occlusion or stenosis. Doppler velocity suggests stenosis in the left external carotid artery (<50%). ?Antegrade right vertebral artery flow. Antegrade left vertebral artery flow. ?Consider CTA neck to evaluate significant of left carotid stenosis, if present.  ?  Follow up in one year is appropriate if clinically indicated. ? ?CT angiogram neck with and without contrast 07/19/2020: ?1. Diffusely small left ICA. No proximal stenosis to suggest underfilling and no clear signs of prior dissection. This could be ?developmental but the carotid canal size and the circle-of-Willis anatomy is not covered. ?2. Carotid atherosclerosis without focal and flow limiting stenosis. ?3. Aortic arch: Atheromatous arch with 3 vessel branching. No acute finding or visible dilatation. ? ?EKG:  ? ?EKG 07/30/2021: Normal sinus rhythm with rate of 79 bpm, left atrial enlargement, normal axis.  Incomplete right bundle branch block.  No evidence of ischemia.  No significant change from EKG 06/02/2020:  ? ?Assessment  ? ?  ICD-10-CM   ?1. Coronary artery disease involving native coronary artery of native heart with angina pectoris ([place])  I25.119 EKG 12-Lead  ?  ?2. Primary hypertension  I10   ?  ?3. Mixed hyperlipidemia  E78.2 fenofibrate (TRICOR) 48 MG tablet  ?  ?4. 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  ? ?There are no discontinued medications.  ?Meds ordered this encounter  ?Medications  ? fenofibrate (TRICOR) 48 MG tablet  ?  Sig: Take 1 tablet (48 mg total) by mouth daily.  ?  Dispense:  90 tablet  ?  Refill:  3  ? ?Orders  Placed This Encounter  ?Procedures  ? EKG 12-Lead  ? ? ?Recommendations:  ? ?SHAVELLE RUNKEL is a 76 y.o. Caucasian female with coronary artery disease and history of MI, underwent stenting to the LAD in 2013 at

## 2021-08-30 ENCOUNTER — Telehealth: Payer: Self-pay

## 2021-08-30 NOTE — Telephone Encounter (Signed)
Patient called and stated that she had a stroke on Monday and was released Tuesday at the [facility]. Patient wants to know if she needs to follow up with you. She does not have a scheduled appointment until 07/31/2022.

## 2021-09-10 ENCOUNTER — Inpatient Hospital Stay: Payer: Medicare HMO

## 2021-09-10 ENCOUNTER — Ambulatory Visit: Payer: Medicare HMO | Admitting: Student

## 2021-09-10 ENCOUNTER — Encounter: Payer: Self-pay | Admitting: Student

## 2021-09-10 VITALS — BP 138/79 | HR 81 | Temp 98.4°F | Resp 17 | Ht 67.0 in | Wt 171.0 lb

## 2021-09-10 DIAGNOSIS — G459 Transient cerebral ischemic attack, unspecified: Secondary | ICD-10-CM

## 2021-09-10 DIAGNOSIS — I25119 Atherosclerotic heart disease of native coronary artery with unspecified angina pectoris: Secondary | ICD-10-CM

## 2021-09-10 DIAGNOSIS — I6523 Occlusion and stenosis of bilateral carotid arteries: Secondary | ICD-10-CM

## 2021-09-10 MED ORDER — CLOPIDOGREL BISULFATE 75 MG PO TABS: 75.0000 mg | ORAL_TABLET | Freq: Every day | ORAL | 3 refills | Status: DC

## 2021-09-10 NOTE — Progress Notes (Signed)
Primary Physician/Referring:  Aletha Halim., PA-C  Patient ID: Christine Evans, female    DOB: January 06, 1946, 76 y.o.   MRN: [id number]  Chief Complaint  Patient presents with   Coronary Artery Disease   Follow-up   HPI:    Christine Evans  is a 76 y.o. 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   Patient was recently admitted at [facility] from 08/07/2021 - 08/28/2021.  She presented with dizziness and recurrent falls as well as intermittent episodes of transient aphasia.  CTA of the head is without acute intracranial abnormalities, did reveal remote left parietal lobe infarct which was small.  Patient was diagnosed with TIA.  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  Patient's symptoms completely resolved and neurology recommended aspirin and Plavix. She is scheduled for outpatient neurology follow up next month. Patient now presents for follow-up.  Patient reports intermittent headaches as well as intermittent right hand numbness since discharge for which she is following up with neurology.  I personally reviewed external records including radiologic findings lab results and external provider notes.  Triglycerides remain elevated.  Blood pressure is fairly well controlled.  Past Medical History:  Diagnosis Date   CAD (coronary artery disease)    Coronary artery disease    Diabetes mellitus without complication ([place])    Hyperlipidemia    Hypertension    Past Surgical History:  Procedure Laterality Date   HIP FRACTURE SURGERY Right 2015   WRIST SURGERY Left 2009   Family History  Problem Relation Age of Onset   Heart disease Mother    Heart failure Mother    Alzheimer's disease Mother    Heart disease Brother    Heart failure Brother     Social  History   Tobacco Use   Smoking status: Never   Smokeless tobacco: Never  Substance Use Topics   Alcohol use: Not Currently   Marital Status: Widowed  ROS  Review of Systems  Constitutional: Negative for malaise/fatigue and weight gain.  Cardiovascular:  Negative for chest pain, claudication, dyspnea on exertion, leg swelling, near-syncope, orthopnea, palpitations, paroxysmal nocturnal dyspnea and syncope.  Neurological:  Positive for headaches (intermittent).  Objective  Blood pressure 138/79, pulse 81, temperature 98.4 F (36.9 C), temperature source Temporal, resp. rate 17, height 5' 7"  (1.702 m), weight 171 lb (77.6 kg), SpO2 98 %.     09/10/2021    1:21 PM 07/30/2021    3:13 PM 07/28/2020    1:41 PM  Vitals with BMI  Height 5' 7"  5' 7"  5' 7"   Weight 171 lbs 168 lbs 176 lbs 10 oz  BMI 26.78 96.29 52.84  Systolic 132 440 102  Diastolic 79 71 70  Pulse 81 75 72     Physical Exam Vitals reviewed.  Constitutional:      Appearance: She is obese.  Neck:     Vascular: No JVD.  Cardiovascular:     Rate and Rhythm: Normal rate and regular rhythm.     Pulses: Intact distal pulses.          Carotid pulses are  on the right side with bruit and  on the left side with bruit.    Heart sounds: Murmur heard.  Early systolic murmur is present with a grade of 2/6 at the upper right  sternal border.    No gallop.  Pulmonary:     Effort: Pulmonary effort is normal.     Breath sounds: Normal breath sounds.  Musculoskeletal:     Right lower leg: No edema.     Left lower leg: No edema.   Laboratory examination:    External labs:  09/05/2021: Sodium 130, potassium 4.4, BUN 21, creatinine 0.96, AST 16, ALT 13, GFR >60 Hgb 12.7, HCT 37.8, MCV 90.5, platelet 212  08/28/2021: A1c 7.2% triglycerides 288, total cholesterol 122, HDL 36, LDL 28 TSH 2.41  07/11/2021:  Total cholesterol 174, triglycerides 264, HDL 40, LDL 77.  Non-HDL cholesterol 134.  A1c 7.0%.  BUN 28, creatinine 1.31,  EGFR 43 mL, potassium 4.5, LFTs normal.  Magnesium 1.3.  Hb 11.9/HCT 35.9, platelets 167, normal indicis.  Labs 05/18/2020:  A1c 6.7%.  TSH normal.  Allergies  No Known Allergies   Medications Prior to Visit:   Outpatient Medications Prior to Visit  Medication Sig Dispense Refill   atorvastatin (LIPITOR) 80 MG tablet Take 80 mg by mouth daily.     Cholecalciferol 125 MCG (5000 UT) capsule Take 1 capsule by mouth daily.     [facility]S ASPIRIN 325 MG tablet TAKE ONE TABLET (325 MG DOSE) BY MOUTH DAILY.  0   cyclobenzaprine (FLEXERIL) 10 MG tablet Take 1 tablet (10 mg total) by mouth daily. 30 tablet 2   fenofibrate (TRICOR) 48 MG tablet Take 1 tablet (48 mg total) by mouth daily. 90 tablet 3   glimepiride (AMARYL) 2 MG tablet TAKE ONE TABLET (2 MG DOSE) BY MOUTH 2 (TWO) TIMES DAILY.  1   Krill Oil (OMEGA-3) 500 MG CAPS Take 1 capsule by mouth daily.     lisinopril-hydrochlorothiazide (ZESTORETIC) 10-12.5 MG tablet TAKE 1 TABLET BY MOUTH EVERY DAY IN THE MORNING 90 tablet 3   Magnesium Gluconate 550 MG TABS Take 1 tablet by mouth daily.     meloxicam (MOBIC) 7.5 MG tablet TAKE ONE TABLET (7.5 MG TOTAL) BY MOUTH DAILY.  2   metoprolol succinate (TOPROL-XL) 50 MG 24 hr tablet Take 50 mg by mouth in the morning and at bedtime.     nitroGLYCERIN (NITROSTAT) 0.4 MG SL tablet 1 (ONE) TAB SUB TAB SUBLINGUA TAB SUBLINGUAL EVERY 5 MINUTES AS NEEDED FOR CHEST PAIN. 30 tablet 6   omeprazole (PRILOSEC) 20 MG capsule Take by mouth daily.  2   oxyCODONE-acetaminophen (PERCOCET/ROXICET) 5-325 MG tablet Take 1 tablet by mouth every 6 (six) hours as needed. for pain  0   Zinc Gluconate 10 MG LOZG Take 1 tablet by mouth daily.     atorvastatin (LIPITOR) 40 MG tablet Take 1 tablet by mouth at bedtime. (Patient not taking: Reported on 09/10/2021)     metFORMIN (GLUCOPHAGE) 1000 MG tablet Take 1 tablet by mouth in the morning and at bedtime. (Patient not taking: Reported on 09/10/2021)     No facility-administered  medications prior to visit.   Final Medications at End of Visit    Current Meds  Medication Sig   atorvastatin (LIPITOR) 80 MG tablet Take 80 mg by mouth daily.   Cholecalciferol 125 MCG (5000 UT) capsule Take 1 capsule by mouth daily.   clopidogrel (PLAVIX) 75 MG tablet Take 1 tablet (75 mg total) by mouth daily.   CVS ASPIRIN 325 MG tablet TAKE ONE TABLET (325 MG DOSE) BY MOUTH DAILY.   cyclobenzaprine (FLEXERIL) 10 MG tablet Take 1 tablet (10 mg total) by mouth daily.   fenofibrate (TRICOR)  48 MG tablet Take 1 tablet (48 mg total) by mouth daily.   glimepiride (AMARYL) 2 MG tablet TAKE ONE TABLET (2 MG DOSE) BY MOUTH 2 (TWO) TIMES DAILY.   Krill Oil (OMEGA-3) 500 MG CAPS Take 1 capsule by mouth daily.   lisinopril-hydrochlorothiazide (ZESTORETIC) 10-12.5 MG tablet TAKE 1 TABLET BY MOUTH EVERY DAY IN THE MORNING   Magnesium Gluconate 550 MG TABS Take 1 tablet by mouth daily.   meloxicam (MOBIC) 7.5 MG tablet TAKE ONE TABLET (7.5 MG TOTAL) BY MOUTH DAILY.   metoprolol succinate (TOPROL-XL) 50 MG 24 hr tablet Take 50 mg by mouth in the morning and at bedtime.   nitroGLYCERIN (NITROSTAT) 0.4 MG SL tablet 1 (ONE) TAB SUB TAB SUBLINGUA TAB SUBLINGUAL EVERY 5 MINUTES AS NEEDED FOR CHEST PAIN.   omeprazole (PRILOSEC) 20 MG capsule Take by mouth daily.   oxyCODONE-acetaminophen (PERCOCET/ROXICET) 5-325 MG tablet Take 1 tablet by mouth every 6 (six) hours as needed. for pain   Zinc Gluconate 10 MG LOZG Take 1 tablet by mouth daily.    Radiology:   No results found.  CT head 08/28/2021: Small old left frontal convexity infarct with encephalomalacia. No acute abnormality. No change from yesterday.    CTA head/neck 08/28/2021:  1.  Occluded left cervical internal carotid artery, age of which is indeterminate, new since the 05/30/2017 CTA head and neck exam.  2.  Occluded left intracranial internal carotid artery,new since the 05/30/2017 CTA head and neck exam.  3.  Mild to moderate stenosis mid  left M1 middle cerebral artery segment.  4.  No aneurysm identified.  5.  Posterior cervical spine metallic epidural stimulator lead.   Cardiac Studies:   Coronary angiogram 11/14/2011: Stent to LAD 3.0x15 mm Xience for acute anterior MI in [place], [place], [place]  Lexiscan Tetrofosmin Stress Test  06/05/2020: Nondiagnostic ECG stress. Myocardial perfusion is normal. Overall LV systolic function is normal without regional wall motion abnormalities. Stress LV EF: 66%.  No significant change from 06/17/2014. Low risk.  Echocardiogram 06/16/2020: Normal LV systolic function with visual EF 60-65%. Left ventricle cavity is normal in size. Mild left ventricular hypertrophy, septal bulge present.  Normal global wall motion. Normal diastolic filling pattern, normal LAP.  Left atrial cavity is slightly dilated. Native mitral valve. No evidence of mitral stenosis. Mild (Grade I) mitral regurgitation. Mild mitral valve leaflet thickening with mild calcification. Structurally normal tricuspid valve. No evidence of tricuspid stenosis. Mild to moderate tricuspid regurgitation. Mild pulmonary hypertension. RVSP measures 35 mmHg. IVC is dilated with a respiratory response of >50%. Compared to prior study dated 06/09/2014: Mild MR is new, Mild PHTN is new, otherwise no significant change.   Carotid artery duplex 06/16/2020: Stenosis in the right internal carotid artery (16-49%). There is markedly reduced and dampened velocity noted in the left ICA without significant plaque burden and may suggest distal occlusion or stenosis. Doppler velocity suggests stenosis in the left external carotid artery (<50%). Antegrade right vertebral artery flow. Antegrade left vertebral artery flow. Consider CTA neck to evaluate significant of left carotid stenosis, if present.  Follow up in one year is appropriate if clinically indicated.  External echocardiogram 08/28/2021: Left Ventricle: Systolic function is normal. EF:  60-65%. Ejection fraction measured by 3D is 60%,    Left Ventricle: Wall motion is normal.    Aortic Valve: The aortic valve is tricuspid. The leaflets are mildly thickened. There is mild sclerosis.    Aortic Valve: There is no regurgitation.    Mitral Valve:  There is mild regurgitation.    Tricuspid Valve: There is trace regurgitation.    Left Atrium: Injection of agitated saline documents no interatrial   EKG:   EKG 07/30/2021: Normal sinus rhythm with rate of 79 bpm, left atrial enlargement, normal axis.  Incomplete right bundle branch block.  No evidence of ischemia.  No significant change from EKG 06/02/2020:   Assessment     ICD-10-CM   1. Coronary artery disease involving native coronary artery of native heart with angina pectoris (HCC)  I25.119 LONG TERM MONITOR (3-14 DAYS)    2. TIA (transient ischemic attack)  G45.9 LONG TERM MONITOR (3-14 DAYS)    3. Asymptomatic bilateral carotid artery stenosis  I65.23 PCV CAROTID DUPLEX (BILATERAL)     Medications Discontinued During This Encounter  Medication Reason   atorvastatin (LIPITOR) 40 MG tablet    metFORMIN (GLUCOPHAGE) 1000 MG tablet     Meds ordered this encounter  Medications   clopidogrel (PLAVIX) 75 MG tablet    Sig: Take 1 tablet (75 mg total) by mouth daily.    Dispense:  90 tablet    Refill:  3   Orders Placed This Encounter  Procedures   LONG TERM MONITOR (3-14 DAYS)    Standing Status:   Future    Order Specific Question:   Where should this test be performed?    Answer:   [facility]    Order Specific Question:   Does the patient have an implanted cardiac device?    Answer:   No    Order Specific Question:   Prescribed days of wear    Answer:   40    Order Specific Question:   Type of enrollment    Answer:   Clinic Enrollment    Recommendations:   Christine Evans is a 76 y.o. 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   Patient was recently admitted at [facility] from 08/07/2021 - 08/28/2021.  She presented with dizziness and recurrent falls as well as intermittent episodes of transient aphasia.  CTA of the head is without acute intracranial abnormalities, did reveal remote left parietal lobe infarct which was small.  Patient was diagnosed with TIA.  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  Patient's symptoms completely resolved and neurology recommended aspirin and Plavix. She is scheduled for outpatient neurology follow up next month. Patient now presents for follow-up.  Patient's blood pressure is fairly well controlled, however counseled her regarding the importance of DASH diet.  She is tolerating dual antiplatelet therapy with aspirin and Plavix, will continue this.  Lipids are fairly well controlled with the exception of triglycerides, counseled patient regarding the importance of dietary changes.  Given recent TIA will obtain 2-week cardiac monitor to evaluate for underlying cardiac arrhythmias.  We will also plan for BP carotid artery surveillance in 6 months.  Follow-up in 6 months, sooner if needed.   This was a 45-minute encounter with face-to-face counseling, medical records review, coordination of care, explanation of complex medical issues, complex medical decision making.  A significant portion of today's office it was spent reviewing external records from recent hospitalization.    Alethia Berthold, PA-C 09/10/2021, 3:11 PM Office: [phone]

## 2021-09-10 NOTE — Patient Instructions (Signed)

## 2021-09-18 ENCOUNTER — Telehealth: Payer: Self-pay

## 2021-09-27 ENCOUNTER — Other Ambulatory Visit: Payer: Self-pay | Admitting: Cardiology

## 2021-09-27 DIAGNOSIS — I10 Essential (primary) hypertension: Secondary | ICD-10-CM

## 2021-10-01 NOTE — Progress Notes (Signed)
Called and spoke with patient regarding her heart monitor results.

## 2022-03-06 NOTE — Telephone Encounter (Signed)
Error

## 2022-03-13 ENCOUNTER — Ambulatory Visit: Payer: Medicare HMO

## 2022-03-13 DIAGNOSIS — I6523 Occlusion and stenosis of bilateral carotid arteries: Secondary | ICD-10-CM

## 2022-03-25 ENCOUNTER — Ambulatory Visit: Payer: Medicare HMO | Admitting: Cardiology

## 2022-03-25 ENCOUNTER — Encounter: Payer: Self-pay | Admitting: Cardiology

## 2022-03-25 VITALS — BP 131/69 | HR 70 | Resp 16 | Ht 67.0 in | Wt 168.0 lb

## 2022-03-25 DIAGNOSIS — I25119 Atherosclerotic heart disease of native coronary artery with unspecified angina pectoris: Secondary | ICD-10-CM

## 2022-03-25 DIAGNOSIS — E782 Mixed hyperlipidemia: Secondary | ICD-10-CM

## 2022-03-25 DIAGNOSIS — I6523 Occlusion and stenosis of bilateral carotid arteries: Secondary | ICD-10-CM

## 2022-03-25 DIAGNOSIS — I10 Essential (primary) hypertension: Secondary | ICD-10-CM

## 2022-03-25 DIAGNOSIS — Z8673 Personal history of transient ischemic attack (TIA), and cerebral infarction without residual deficits: Secondary | ICD-10-CM

## 2022-03-25 MED ORDER — CLOPIDOGREL BISULFATE 75 MG PO TABS: 75.0000 mg | ORAL_TABLET | Freq: Every day | ORAL | 3 refills | Status: AC

## 2022-03-25 NOTE — Progress Notes (Signed)
Primary Physician/Referring:  Aletha Halim., PA-C  Patient ID: Christine Evans, female    DOB: [birth year], 76 y.o.   MRN: [id number]  Chief Complaint  Patient presents with   Coronary Artery Disease   Hyperlipidemia   Carotid   Follow-up    6 month   HPI:    Christine Evans  is a 76 y.o. Caucasian female with coronary artery disease and history of MI and stenting to the LAD in 2013 at [facility], hypertension, mixed hyperlipidemia and diabetes mellitus with stage IIIa chronic kidney disease,  left carotid artery occlusion and mild to moderate asymptomatic right ICA stenosis, TIA in May 2023 and remote left parietal lobe infarct presents here for 34-monthoffice visit.  She is presently asymptomatic, doing well, continues to remain active.  Past Medical History:  Diagnosis Date   CAD (coronary artery disease)    Coronary artery disease    Diabetes mellitus without complication ([place]    Hyperlipidemia    Hypertension    Past Surgical History:  Procedure Laterality Date   HIP FRACTURE SURGERY Right 2015   WRIST SURGERY Left 2009   Family History  Problem Relation Age of Onset   Heart disease Mother    Heart failure Mother    Alzheimer's disease Mother    Heart disease Brother    Heart failure Brother     Social History   Tobacco Use   Smoking status: Never   Smokeless tobacco: Never  Substance Use Topics   Alcohol use: Not Currently   Marital Status: Widowed  ROS  Review of Systems  Cardiovascular:  Negative for chest pain, dyspnea on exertion and leg swelling.   Objective  Blood pressure 131/69, pulse 70, resp. rate 16, height 5' 7" (1.702 m), weight 168 lb (76.2 kg), SpO2 96 %.     03/25/2022    1:02 PM 09/10/2021    1:21 PM 07/30/2021    3:13 PM  Vitals with BMI  Height 5' 7" 5' 7" 5' 7"  Weight 168 lbs 171 lbs 168 lbs  BMI 26.31 267.67220.94 Systolic [id number] Diastolic 69 79 71  Pulse 70 81 75     Physical Exam Vitals reviewed.  Neck:      Vascular: Carotid bruit (left) present. No JVD.  Cardiovascular:     Rate and Rhythm: Normal rate and regular rhythm.     Pulses:          Popliteal pulses are 2+ on the right side and 2+ on the left side.       Dorsalis pedis pulses are 0 on the right side and 0 on the left side.       Posterior tibial pulses are 0 on the right side and 0 on the left side.     Heart sounds: Murmur heard.     Early systolic murmur is present with a grade of 2/6 at the upper right sternal border.     No gallop.  Pulmonary:     Effort: Pulmonary effort is normal.     Breath sounds: Normal breath sounds.  Musculoskeletal:     Right lower leg: No edema.     Left lower leg: No edema.    Laboratory examination:    External labs:  09/05/2021:  Sodium 130, potassium 4.4, BUN 21, creatinine 0.96, AST 16, ALT 13, GFR >60  Hgb 12.7, HCT 37.8, MCV 90.5, platelet 212  A1c 7.2% TSH 2.41  Total cholesterol  122, triglycerides 288, HDL 36, LDL 28  07/11/2021:  BUN 28, creatinine 1.31, EGFR 43 mL, potassium 4.5, LFTs normal.  Magnesium 1.3.  Allergies  No Known Allergies   Final Medications at End of Visit     Current Outpatient Medications:    atorvastatin (LIPITOR) 80 MG tablet, Take 80 mg by mouth daily., Disp: , Rfl:    Cholecalciferol 125 MCG (5000 UT) capsule, Take 1 capsule by mouth daily., Disp: , Rfl:    fenofibrate (TRICOR) 48 MG tablet, Take 1 tablet (48 mg total) by mouth daily., Disp: 90 tablet, Rfl: 3   glimepiride (AMARYL) 2 MG tablet, TAKE ONE TABLET (2 MG DOSE) BY MOUTH 2 (TWO) TIMES DAILY., Disp: , Rfl: 1   Krill Oil (OMEGA-3) 500 MG CAPS, Take 1 capsule by mouth daily., Disp: , Rfl:    lisinopril-hydrochlorothiazide (ZESTORETIC) 10-12.5 MG tablet, TAKE 1 TABLET EVERY MORNING, Disp: 90 tablet, Rfl: 3   Magnesium Gluconate 550 MG TABS, Take 1 tablet by mouth daily., Disp: , Rfl:    metoprolol succinate (TOPROL-XL) 50 MG 24 hr tablet, Take 50 mg by mouth in the morning and at  bedtime., Disp: , Rfl:    nitroGLYCERIN (NITROSTAT) 0.4 MG SL tablet, 1 (ONE) TAB SUB TAB SUBLINGUA TAB SUBLINGUAL EVERY 5 MINUTES AS NEEDED FOR CHEST PAIN., Disp: 30 tablet, Rfl: 6   omeprazole (PRILOSEC) 20 MG capsule, Take by mouth daily., Disp: , Rfl: 2   oxyCODONE-acetaminophen (PERCOCET/ROXICET) 5-325 MG tablet, Take 1 tablet by mouth every 6 (six) hours as needed. for pain, Disp: , Rfl: 0   Zinc Gluconate 10 MG LOZG, Take 1 tablet by mouth daily., Disp: , Rfl:    clopidogrel (PLAVIX) 75 MG tablet, Take 1 tablet (75 mg total) by mouth daily., Disp: 90 tablet, Rfl: 3  Radiology:   CT head 08/28/2021: Small old left frontal convexity infarct with encephalomalacia. No acute abnormality. No change from yesterday.    CTA head/neck 08/28/2021:  1.  Occluded left cervical internal carotid artery, age of which is indeterminate, new since the 05/30/2017 CTA head and neck exam.  2.  Occluded left intracranial internal carotid artery,new since the 05/30/2017 CTA head and neck exam.  3.  Mild to moderate stenosis mid left M1 middle cerebral artery segment.  4.  No aneurysm identified.  5.  Posterior cervical spine metallic epidural stimulator lead.   Cardiac Studies:   Coronary angiogram 11/14/2011: Stent to LAD 3.0x15 mm Xience for acute anterior MI in [place], [place], [place]  Lexiscan Tetrofosmin Stress Test  06/05/2020: Nondiagnostic ECG stress. Myocardial perfusion is normal. Overall LV systolic function is normal without regional wall motion abnormalities. Stress LV EF: 66%.  No significant change from 06/17/2014. Low risk.  External echocardiogram 08/28/2021: Left Ventricle: Systolic function is normal. EF: 60-65%. Ejection fraction measured by 3D is 60%,    Left Ventricle: Wall motion is normal.    Aortic Valve: The aortic valve is tricuspid. The leaflets are mildly thickened. There is mild sclerosis.    Aortic Valve: There is no regurgitation.    Mitral Valve: There is mild  regurgitation.    Tricuspid Valve: There is trace regurgitation.    Left Atrium: Injection of agitated saline documents no interatrial   Carotid artery duplex 03/13/2022: Duplex suggests stenosis in the right internal carotid artery (16-49%). Duplex suggests stenosis in the left internal carotid artery (total occlusion). However CTA neck 07/2021 revealed a diffusely small lumen  ICA without stenosis) Antegrade right vertebral artery flow. Antegrade  left vertebral artery flow. No significant change since 06/16/2020. Follow up in 12 months is appropriate if clinically indicated.  EKG:   EKG 03/26/2022: Normal sinus rhythm at the rate of 73 bpm, left atrial enlargement, otherwise normal EKG.  Compared to 07/30/2021, no change.  Assessment     ICD-10-CM   1. Coronary artery disease involving native coronary artery of native heart with angina pectoris (HCC)  I25.119 EKG 12-Lead    clopidogrel (PLAVIX) 75 MG tablet    2. Asymptomatic bilateral carotid artery stenosis  I65.23 PCV CAROTID DUPLEX (BILATERAL)    3. Primary hypertension  I10     4. Mixed hyperlipidemia  E78.2     5. History of TIA (transient ischemic attack)  Z86.73 clopidogrel (PLAVIX) 75 MG tablet     Medications Discontinued During This Encounter  Medication Reason   cyclobenzaprine (FLEXERIL) 10 MG tablet Patient Preference   meloxicam (MOBIC) 7.5 MG tablet Patient Preference   [facility]S ASPIRIN 325 MG tablet Change in therapy   clopidogrel (PLAVIX) 75 MG tablet Reorder    Meds ordered this encounter  Medications   clopidogrel (PLAVIX) 75 MG tablet    Sig: Take 1 tablet (75 mg total) by mouth daily.    Dispense:  90 tablet    Refill:  3   Orders Placed This Encounter  Procedures   EKG 12-Lead    Recommendations:   TAWANNA FUNK is a 76 y.o. Caucasian female with coronary artery disease and history of MI and stenting to the LAD in 2013 at [facility], hypertension, mixed hyperlipidemia and diabetes mellitus  with stage IIIa chronic kidney disease,  left carotid artery occlusion and mild to moderate asymptomatic right ICA stenosis, TIA in May 2023 and remote left parietal lobe infarct presents here for 28-monthoffice visit.   1. Coronary artery disease involving native coronary artery of native heart with angina pectoris ([facility] Patient has no recurrence of angina pectoris, she continues to remain active, she has lost about 25 to 30 pounds in weight over the past 1 to 2 years and has maintained weight loss.  I have congratulated her on this, continue present medications, she can stop the aspirin as she has not had any recurrence of TIA and her coronary artery disease is stable. She is scheduled for battery change for the spinal stimulator, can be done under low risk and Plavix can be held for 7 days as requested by the surgeon.  Letter sent.  She is also having dental implants done as well.  Low risk procedures.  2. History of TIA (transient ischemic attack) No further episodes of TIA, discontinue aspirin, continue Plavix.  3. Asymptomatic bilateral carotid artery stenosis Carotid artery stenosis remained stable, will recheck in 1 year.  4. Primary hypertension Blood pressure under excellent control.  5. Mixed hyperlipidemia External labs reviewed, lipids under excellent control.  No changes in the medications were done today.  Office visit in a year or sooner if problems.   JAdrian Prows MD, F[facility]thopedic And Sports Surgery Center12/18/2023, 1:35 PM Office: [phone]: [phone]: [phone]

## 2022-05-23 ENCOUNTER — Other Ambulatory Visit: Payer: Self-pay | Admitting: Cardiology

## 2022-05-23 DIAGNOSIS — E782 Mixed hyperlipidemia: Secondary | ICD-10-CM

## 2022-07-31 ENCOUNTER — Ambulatory Visit: Payer: Medicare HMO | Admitting: Cardiology

## 2023-02-25 ENCOUNTER — Telehealth: Payer: Self-pay | Admitting: *Deleted

## 2023-02-25 NOTE — Telephone Encounter (Signed)
   Pre-operative Risk Assessment    Patient Name: Christine Evans  DOB: August 02, 1945 MRN: [id number]{ HEARTCARE STAFF-IMPORTANT INSTRUCTIONS 1 Red and Blue Text will auto delete once note is signed or closed. 2 Press F2 to navigate through template.   3 On drop down lists, L click to select >> R click to activate next field 4 Reason for Visit format is IMPORTANT!!  See Directions on No. 2 below. 5 Please review chart to determine if there is already a clearance note open for this procedure!!  DO NOT duplicate if a note already exists!!      DATE OF LAST VISIT: 03/25/22 DR. Jacinto Halim DATE OF NEXT VISIT: 03/26/23 DR. [facility]    Request for Surgical Clearance    Procedure:   ROBOTIC ILEOCOLECTOMY     Date of Surgery:  Clearance TBD [facility] FORM STATES URGENT) ; HOWEVER I FOUND A DATE FOR SURGERY 04/21/23                               Surgeon:  DR. Wyatt Portela Surgeon's Group or Practice Name:  [facility] Phone number:  [phone] Fax number:  [phone]   Type of Clearance Requested:   - Medical  - Pharmacy:  Hold Clopidogrel (Plavix) x 5 DAYS PRIOR   Type of Anesthesia:  Not Indicated   Additional requests/questions:    [name]   02/25/2023, 3:12 PM

## 2023-02-25 NOTE — Telephone Encounter (Signed)
   Name: Christine Evans  DOB: [birth year]  MRN: [id number]  Primary Cardiologist: None  Chart reviewed as part of pre-operative protocol coverage. The patient has an upcoming visit scheduled with Dr. Jacinto Halim on 03/26/2023 at which time clearance can be addressed in case there are any issues that would impact surgical recommendations.  I added preop FYI to appointment note so that provider is aware to address at time of outpatient visit.  Per office protocol the cardiology provider should forward their finalized clearance decision and recommendations regarding antiplatelet therapy to the requesting party below.    I will route this message as FYI to requesting party and remove this message from the preop box as separate preop APP input not needed at this time.   Please call with any questions.  Napoleon Form, Leodis Rains, NP  02/25/2023, 3:24 PM

## 2023-03-19 ENCOUNTER — Other Ambulatory Visit: Payer: Medicare HMO

## 2023-03-19 ENCOUNTER — Ambulatory Visit (HOSPITAL_COMMUNITY)
Admission: RE | Admit: 2023-03-19 | Payer: Medicare HMO | Source: Ambulatory Visit | Attending: Cardiology | Admitting: Cardiology

## 2023-03-26 ENCOUNTER — Other Ambulatory Visit: Payer: Self-pay | Admitting: Cardiology

## 2023-03-26 ENCOUNTER — Ambulatory Visit: Payer: Medicare HMO | Admitting: Cardiology

## 2023-03-26 DIAGNOSIS — E782 Mixed hyperlipidemia: Secondary | ICD-10-CM
